# Patient Record
Sex: Male | Born: 1965 | Race: White | Hispanic: No | Marital: Married
Health system: Southern US, Community
[De-identification: ages and names within clinical notes are randomized; demographics above are authoritative.]

---

## 2000-09-01 ENCOUNTER — Encounter: Admission: RE | Admit: 2000-09-01 | Discharge: 2000-11-30 | Payer: Self-pay | Admitting: Family Medicine

## 2001-06-11 ENCOUNTER — Encounter: Payer: Self-pay | Admitting: Family Medicine

## 2001-06-11 ENCOUNTER — Encounter: Admission: RE | Admit: 2001-06-11 | Discharge: 2001-06-11 | Payer: Self-pay | Admitting: Family Medicine

## 2002-08-15 ENCOUNTER — Encounter: Payer: Self-pay | Admitting: Family Medicine

## 2002-08-15 ENCOUNTER — Encounter: Admission: RE | Admit: 2002-08-15 | Discharge: 2002-08-15 | Payer: Self-pay | Admitting: Family Medicine

## 2002-08-25 ENCOUNTER — Encounter: Admission: RE | Admit: 2002-08-25 | Discharge: 2002-08-25 | Payer: Self-pay | Admitting: Family Medicine

## 2002-08-25 ENCOUNTER — Encounter: Payer: Self-pay | Admitting: Family Medicine

## 2003-05-14 ENCOUNTER — Emergency Department (HOSPITAL_COMMUNITY): Admission: EM | Admit: 2003-05-14 | Discharge: 2003-05-14 | Payer: Self-pay | Admitting: *Deleted

## 2004-02-26 ENCOUNTER — Ambulatory Visit (HOSPITAL_BASED_OUTPATIENT_CLINIC_OR_DEPARTMENT_OTHER): Admission: RE | Admit: 2004-02-26 | Discharge: 2004-02-26 | Payer: Self-pay | Admitting: Internal Medicine

## 2004-07-26 ENCOUNTER — Ambulatory Visit: Payer: Self-pay | Admitting: Internal Medicine

## 2004-10-03 ENCOUNTER — Ambulatory Visit: Payer: Self-pay | Admitting: Internal Medicine

## 2005-06-20 ENCOUNTER — Ambulatory Visit: Payer: Self-pay | Admitting: Internal Medicine

## 2005-07-14 ENCOUNTER — Ambulatory Visit (HOSPITAL_COMMUNITY): Admission: RE | Admit: 2005-07-14 | Discharge: 2005-07-14 | Payer: Self-pay | Admitting: Internal Medicine

## 2005-07-14 ENCOUNTER — Ambulatory Visit: Payer: Self-pay | Admitting: Internal Medicine

## 2006-04-08 ENCOUNTER — Ambulatory Visit: Payer: Self-pay | Admitting: Family Medicine

## 2006-04-13 ENCOUNTER — Ambulatory Visit: Payer: Self-pay | Admitting: Internal Medicine

## 2006-09-09 ENCOUNTER — Ambulatory Visit: Payer: Self-pay | Admitting: Family Medicine

## 2006-09-14 ENCOUNTER — Ambulatory Visit: Payer: Self-pay | Admitting: Family Medicine

## 2006-09-14 LAB — CONVERTED CEMR LAB
Mumps IgG: 3.6 — ABNORMAL HIGH
Rubeola IgG: 0.88

## 2006-10-02 ENCOUNTER — Ambulatory Visit: Payer: Self-pay | Admitting: Family Medicine

## 2006-10-08 ENCOUNTER — Ambulatory Visit: Payer: Self-pay | Admitting: Family Medicine

## 2006-12-24 ENCOUNTER — Ambulatory Visit: Payer: Self-pay | Admitting: Family Medicine

## 2006-12-28 ENCOUNTER — Telehealth (INDEPENDENT_AMBULATORY_CARE_PROVIDER_SITE_OTHER): Payer: Self-pay | Admitting: *Deleted

## 2006-12-31 ENCOUNTER — Telehealth (INDEPENDENT_AMBULATORY_CARE_PROVIDER_SITE_OTHER): Payer: Self-pay | Admitting: *Deleted

## 2007-02-26 ENCOUNTER — Encounter: Payer: Self-pay | Admitting: Internal Medicine

## 2007-03-22 ENCOUNTER — Ambulatory Visit: Payer: Self-pay | Admitting: Internal Medicine

## 2007-03-27 ENCOUNTER — Emergency Department (HOSPITAL_COMMUNITY): Admission: EM | Admit: 2007-03-27 | Discharge: 2007-03-27 | Payer: Self-pay | Admitting: Emergency Medicine

## 2007-03-29 ENCOUNTER — Ambulatory Visit: Payer: Self-pay | Admitting: Family Medicine

## 2007-03-30 ENCOUNTER — Ambulatory Visit: Payer: Self-pay | Admitting: Family Medicine

## 2007-03-30 LAB — CONVERTED CEMR LAB
ALT: 31 units/L (ref 0–53)
AST: 24 units/L (ref 0–37)
Cholesterol: 165 mg/dL (ref 0–200)
HDL: 27.7 mg/dL — ABNORMAL LOW (ref >39.0)
LDL Cholesterol: 107 mg/dL — ABNORMAL HIGH (ref 0–99)
Total CHOL/HDL Ratio: 6
Triglycerides: 154 mg/dL — ABNORMAL HIGH (ref 0–149)
VLDL: 31 mg/dL (ref 0–40)

## 2007-03-31 ENCOUNTER — Telehealth (INDEPENDENT_AMBULATORY_CARE_PROVIDER_SITE_OTHER): Payer: Self-pay | Admitting: *Deleted

## 2007-03-31 ENCOUNTER — Encounter: Payer: Self-pay | Admitting: Internal Medicine

## 2007-03-31 ENCOUNTER — Ambulatory Visit: Payer: Self-pay | Admitting: Internal Medicine

## 2007-03-31 ENCOUNTER — Encounter (INDEPENDENT_AMBULATORY_CARE_PROVIDER_SITE_OTHER): Payer: Self-pay | Admitting: Family Medicine

## 2007-04-07 ENCOUNTER — Ambulatory Visit: Payer: Self-pay

## 2007-04-07 ENCOUNTER — Ambulatory Visit: Payer: Self-pay | Admitting: Internal Medicine

## 2007-04-07 ENCOUNTER — Encounter: Payer: Self-pay | Admitting: Internal Medicine

## 2007-04-15 ENCOUNTER — Ambulatory Visit (HOSPITAL_COMMUNITY): Admission: RE | Admit: 2007-04-15 | Discharge: 2007-04-15 | Payer: Self-pay | Admitting: Internal Medicine

## 2007-04-15 ENCOUNTER — Ambulatory Visit: Payer: Self-pay | Admitting: Internal Medicine

## 2007-05-20 ENCOUNTER — Ambulatory Visit: Payer: Self-pay | Admitting: Internal Medicine

## 2007-06-19 DIAGNOSIS — G4733 Obstructive sleep apnea (adult) (pediatric): Secondary | ICD-10-CM

## 2007-06-25 ENCOUNTER — Telehealth: Payer: Self-pay | Admitting: Internal Medicine

## 2007-07-12 ENCOUNTER — Ambulatory Visit: Payer: Self-pay | Admitting: Internal Medicine

## 2007-09-06 ENCOUNTER — Telehealth (INDEPENDENT_AMBULATORY_CARE_PROVIDER_SITE_OTHER): Payer: Self-pay | Admitting: Family Medicine

## 2007-09-14 ENCOUNTER — Encounter: Payer: Self-pay | Admitting: Family Medicine

## 2007-09-14 ENCOUNTER — Ambulatory Visit: Payer: Self-pay | Admitting: Family Medicine

## 2007-09-16 LAB — CONVERTED CEMR LAB: Varicella IgG: 2.96 — ABNORMAL HIGH

## 2007-11-19 ENCOUNTER — Ambulatory Visit: Payer: Self-pay | Admitting: Internal Medicine

## 2008-02-09 ENCOUNTER — Ambulatory Visit: Payer: Self-pay | Admitting: Sports Medicine

## 2008-03-20 ENCOUNTER — Encounter: Payer: Self-pay | Admitting: Internal Medicine

## 2008-03-28 ENCOUNTER — Ambulatory Visit: Payer: Self-pay | Admitting: Sports Medicine

## 2008-03-30 ENCOUNTER — Ambulatory Visit: Payer: Self-pay | Admitting: Internal Medicine

## 2008-04-04 ENCOUNTER — Encounter: Payer: Self-pay | Admitting: Internal Medicine

## 2008-07-21 ENCOUNTER — Ambulatory Visit: Payer: Self-pay | Admitting: Internal Medicine

## 2008-07-31 IMAGING — CR DG CHEST 2V
2 series · 2 of 2 positions shown · non-contrast
Comparison: None.

CLINICAL DATA: Shortness of breath. Left arm weakness. Dizziness.

CHEST - 2 VIEW

[w chest pa]
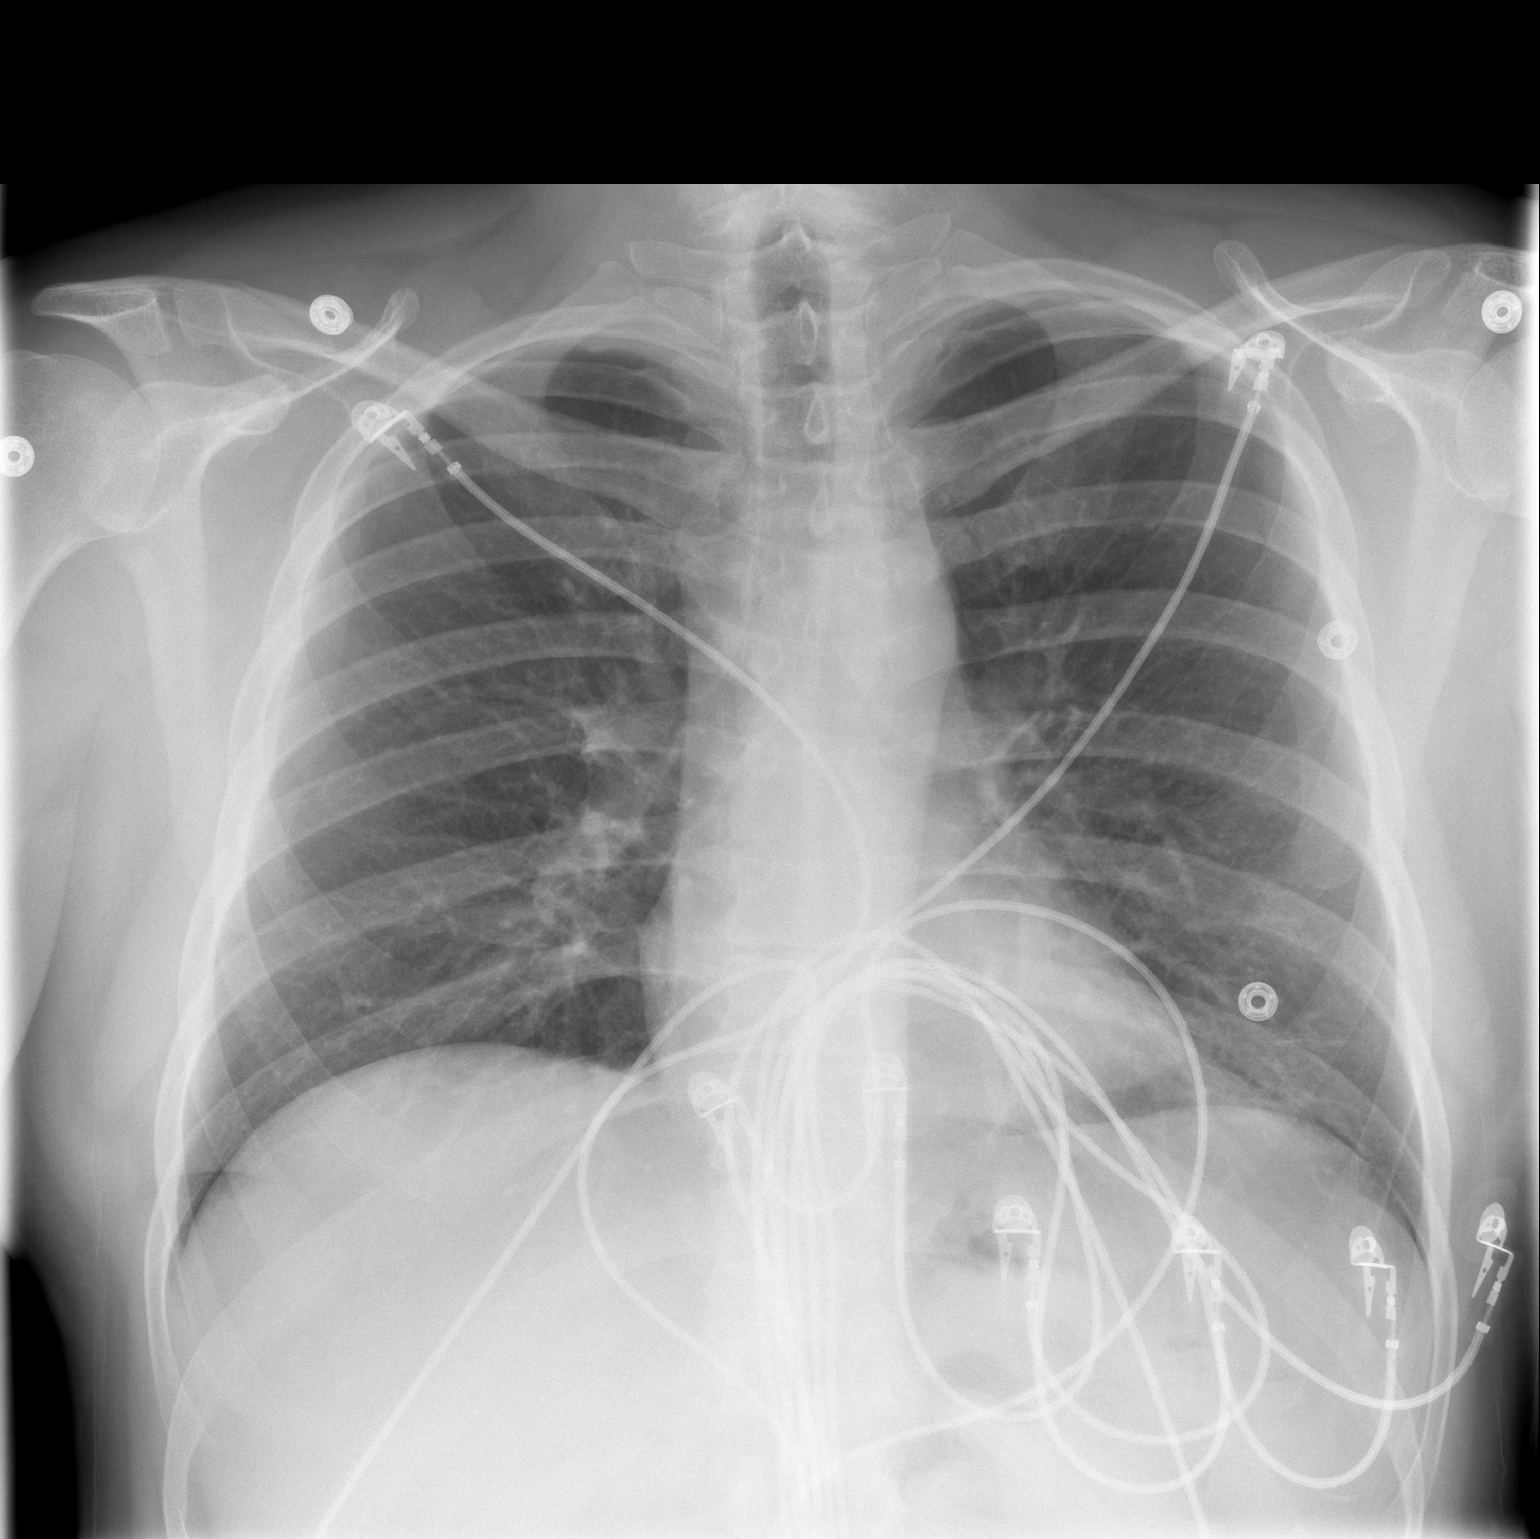

[w chest lat]
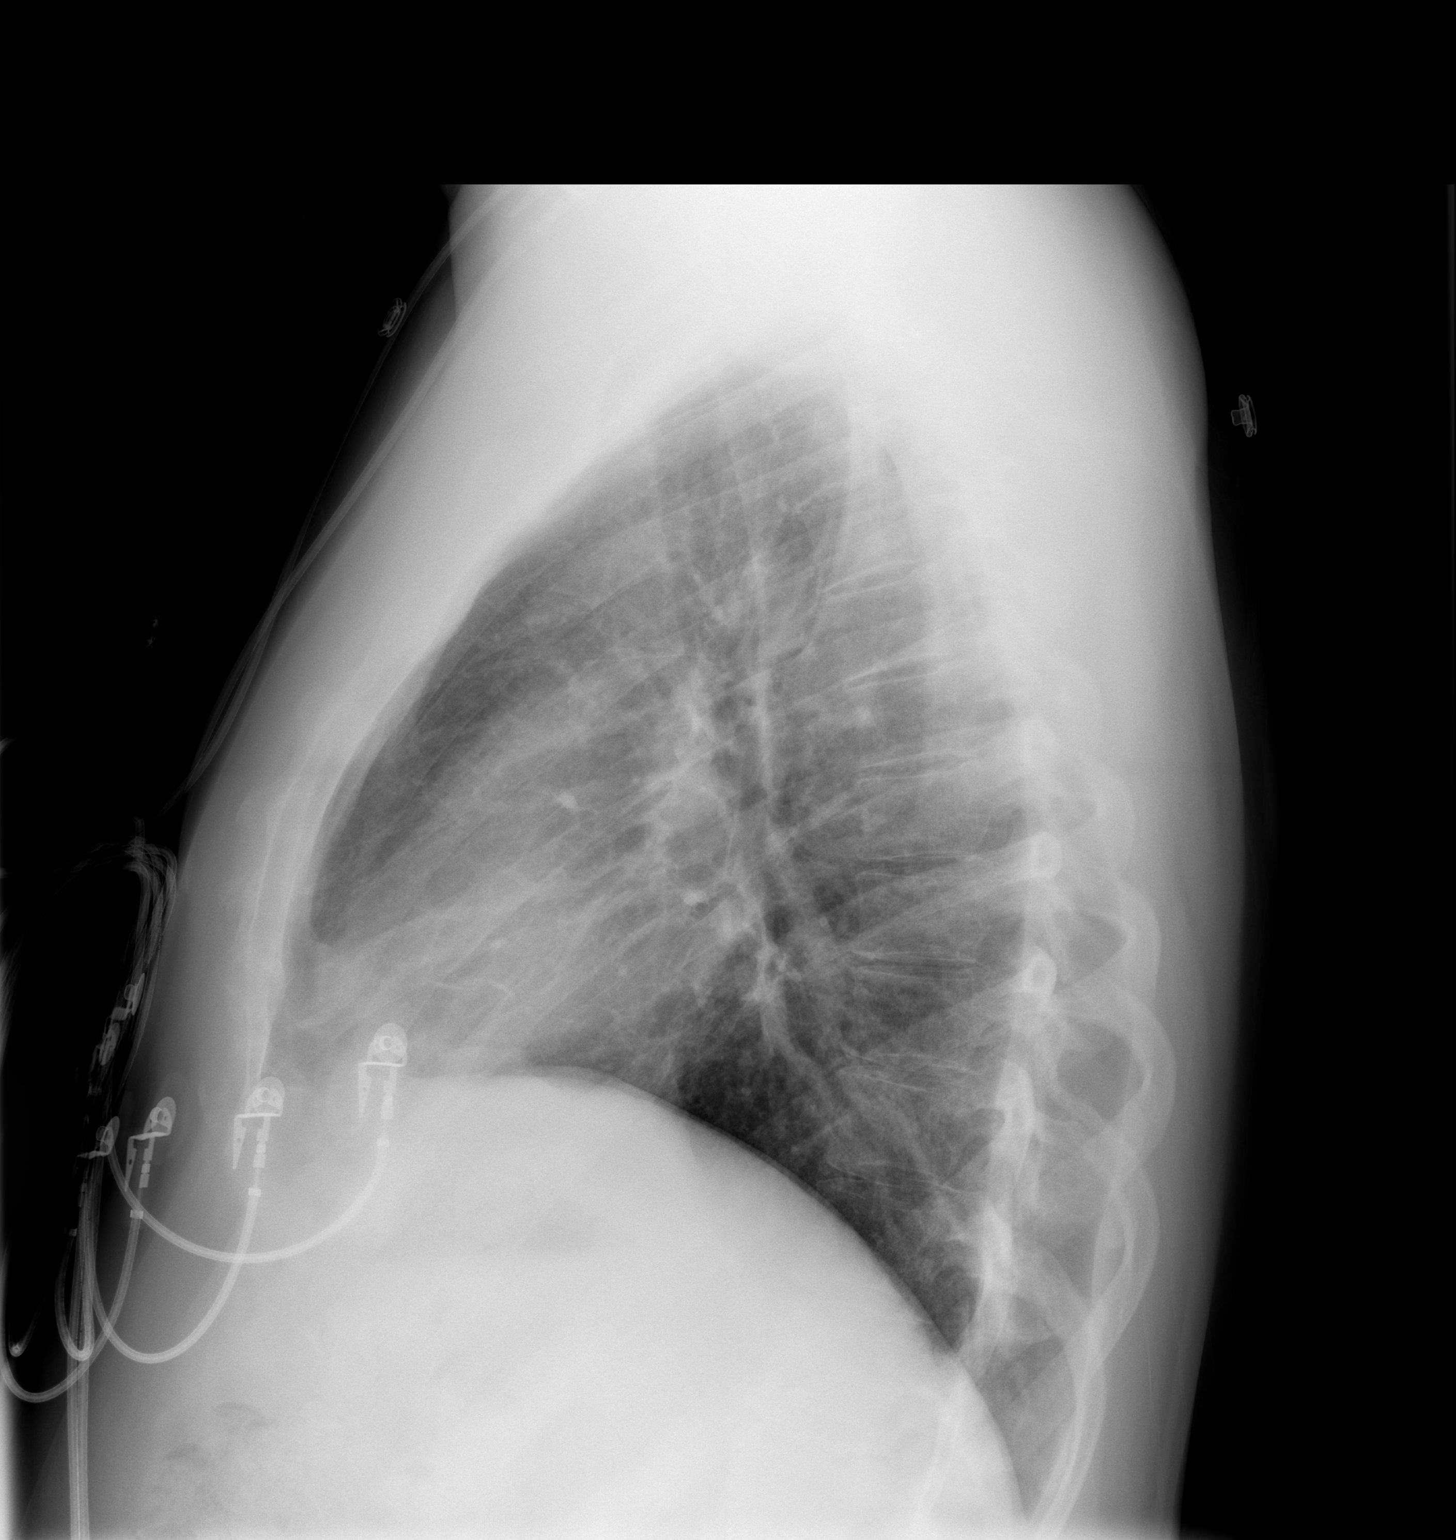

[2 of 2 positions shown; findings below may reference images not displayed]

FINDINGS: Normal sized heart. Small amount of linear density at the left lung
base. Otherwise, clear lungs with normal vascularity. Minimal scoliosis.

IMPRESSION

Mild left basilar atelectasis or scarring.

## 2008-08-04 ENCOUNTER — Ambulatory Visit: Payer: Self-pay | Admitting: Sports Medicine

## 2008-08-04 ENCOUNTER — Encounter: Payer: Self-pay | Admitting: Sports Medicine

## 2008-08-07 ENCOUNTER — Encounter (INDEPENDENT_AMBULATORY_CARE_PROVIDER_SITE_OTHER): Payer: Self-pay | Admitting: *Deleted

## 2008-08-14 ENCOUNTER — Encounter: Admission: RE | Admit: 2008-08-14 | Discharge: 2008-09-07 | Payer: Self-pay | Admitting: Sports Medicine

## 2008-08-16 ENCOUNTER — Encounter: Payer: Self-pay | Admitting: Sports Medicine

## 2008-08-27 ENCOUNTER — Encounter: Payer: Self-pay | Admitting: Internal Medicine

## 2008-09-01 ENCOUNTER — Ambulatory Visit: Payer: Self-pay | Admitting: Sports Medicine

## 2008-09-11 ENCOUNTER — Encounter: Payer: Self-pay | Admitting: Sports Medicine

## 2008-09-12 ENCOUNTER — Encounter: Payer: Self-pay | Admitting: Sports Medicine

## 2008-10-10 ENCOUNTER — Ambulatory Visit: Payer: Self-pay | Admitting: Internal Medicine

## 2008-10-22 ENCOUNTER — Encounter: Payer: Self-pay | Admitting: Internal Medicine

## 2008-11-28 ENCOUNTER — Ambulatory Visit: Payer: Self-pay | Admitting: Internal Medicine

## 2008-12-12 LAB — CONVERTED CEMR LAB
TSH: 0.92 microintl units/mL (ref 0.35–5.50)
Testosterone: 338.79 ng/dL — ABNORMAL LOW (ref 350.00–890.00)

## 2008-12-14 ENCOUNTER — Telehealth (INDEPENDENT_AMBULATORY_CARE_PROVIDER_SITE_OTHER): Payer: Self-pay | Admitting: *Deleted

## 2009-02-08 ENCOUNTER — Encounter: Payer: Self-pay | Admitting: Internal Medicine

## 2009-04-04 ENCOUNTER — Ambulatory Visit: Payer: Self-pay | Admitting: Internal Medicine

## 2009-05-28 ENCOUNTER — Ambulatory Visit: Payer: Self-pay | Admitting: Internal Medicine

## 2009-06-12 ENCOUNTER — Ambulatory Visit: Payer: Self-pay | Admitting: Internal Medicine

## 2009-06-15 ENCOUNTER — Encounter: Payer: Self-pay | Admitting: Internal Medicine

## 2009-06-15 ENCOUNTER — Telehealth: Payer: Self-pay | Admitting: Internal Medicine

## 2009-09-14 ENCOUNTER — Encounter: Payer: Self-pay | Admitting: Internal Medicine

## 2009-10-29 ENCOUNTER — Encounter: Payer: Self-pay | Admitting: Internal Medicine

## 2009-11-20 ENCOUNTER — Encounter: Payer: Self-pay | Admitting: Internal Medicine

## 2009-12-03 ENCOUNTER — Telehealth (INDEPENDENT_AMBULATORY_CARE_PROVIDER_SITE_OTHER): Payer: Self-pay | Admitting: *Deleted

## 2009-12-06 ENCOUNTER — Ambulatory Visit (HOSPITAL_COMMUNITY): Admission: RE | Admit: 2009-12-06 | Discharge: 2009-12-07 | Payer: Self-pay | Admitting: Otolaryngology

## 2009-12-24 ENCOUNTER — Encounter: Payer: Self-pay | Admitting: Internal Medicine

## 2009-12-25 ENCOUNTER — Encounter: Payer: Self-pay | Admitting: Internal Medicine

## 2010-01-31 ENCOUNTER — Encounter: Payer: Self-pay | Admitting: Internal Medicine

## 2010-03-20 ENCOUNTER — Ambulatory Visit (HOSPITAL_BASED_OUTPATIENT_CLINIC_OR_DEPARTMENT_OTHER): Admission: RE | Admit: 2010-03-20 | Discharge: 2010-03-20 | Payer: Self-pay | Admitting: Otolaryngology

## 2010-03-23 ENCOUNTER — Ambulatory Visit: Payer: Self-pay | Admitting: Internal Medicine

## 2010-04-18 ENCOUNTER — Ambulatory Visit: Payer: Self-pay | Admitting: Internal Medicine

## 2010-05-13 ENCOUNTER — Telehealth (INDEPENDENT_AMBULATORY_CARE_PROVIDER_SITE_OTHER): Payer: Self-pay | Admitting: *Deleted

## 2010-06-19 ENCOUNTER — Ambulatory Visit: Payer: Self-pay | Admitting: Internal Medicine

## 2010-06-20 ENCOUNTER — Encounter: Payer: Self-pay | Admitting: Internal Medicine

## 2010-06-21 LAB — CONVERTED CEMR LAB
ALT: 52 units/L (ref 0–53)
AST: 25 units/L (ref 0–37)
Albumin: 4.2 g/dL (ref 3.5–5.2)
Basophils Absolute: 0 10*3/uL (ref 0.0–0.1)
Basophils Relative: 0.7 % (ref 0.0–3.0)
Calcium: 9.6 mg/dL (ref 8.4–10.5)
Eosinophils Relative: 0.9 % (ref 0.0–5.0)
GFR calc non Af Amer: 70.26 mL/min (ref >60.00)
HDL: 38.6 mg/dL — ABNORMAL LOW (ref >39.00)
Hemoglobin: 16.1 g/dL (ref 13.0–17.0)
Lymphocytes Relative: 23.3 % (ref 12.0–46.0)
Monocytes Relative: 6.5 % (ref 3.0–12.0)
Neutro Abs: 4.1 10*3/uL (ref 1.4–7.7)
RBC: 5.58 M/uL (ref 4.22–5.81)
RDW: 15.7 % — ABNORMAL HIGH (ref 11.5–14.6)
Sodium: 141 meq/L (ref 135–145)
Total Protein: 7 g/dL (ref 6.0–8.3)
Triglycerides: 88 mg/dL (ref 0.0–149.0)
VLDL: 17.6 mg/dL (ref 0.0–40.0)
WBC: 6 10*3/uL (ref 4.5–10.5)

## 2010-06-25 ENCOUNTER — Ambulatory Visit: Payer: Self-pay | Admitting: Internal Medicine

## 2010-06-25 ENCOUNTER — Encounter: Payer: Self-pay | Admitting: Internal Medicine

## 2010-06-25 DIAGNOSIS — E785 Hyperlipidemia, unspecified: Secondary | ICD-10-CM

## 2010-06-25 DIAGNOSIS — D489 Neoplasm of uncertain behavior, unspecified: Secondary | ICD-10-CM | POA: Insufficient documentation

## 2010-06-25 LAB — CONVERTED CEMR LAB
HDL goal, serum: 40 mg/dL
LDL Goal: 130 mg/dL

## 2010-06-26 ENCOUNTER — Ambulatory Visit
Admission: RE | Admit: 2010-06-26 | Discharge: 2010-06-26 | Payer: Self-pay | Source: Home / Self Care | Attending: Internal Medicine | Admitting: Internal Medicine

## 2010-06-26 LAB — CONVERTED CEMR LAB: OCCULT 3: NEGATIVE

## 2010-06-27 ENCOUNTER — Encounter (INDEPENDENT_AMBULATORY_CARE_PROVIDER_SITE_OTHER): Payer: Self-pay | Admitting: *Deleted

## 2010-07-30 NOTE — Progress Notes (Signed)
  Phone Note Other Incoming   Request: Send information Summary of Call: Request for records received from  [place] with [facility]. Faxed an EKG from 2008 to [phone]....no stress test to send.

## 2010-07-30 NOTE — Consult Note (Signed)
Summary: [facility]  [facility]   Imported By: Lanelle Bal 09/20/2009 11:38:32  _____________________________________________________________________  External Attachment:    Type:   Image     Comment:   External Document

## 2010-07-30 NOTE — Consult Note (Signed)
Summary: [facility]  [facility]   Imported By: Sherian Rein 02/07/2010 15:16:08  _____________________________________________________________________  External Attachment:    Type:   Image     Comment:   External Document

## 2010-07-30 NOTE — Consult Note (Signed)
Summary: [facility]  [facility]   Imported By: Lanelle Bal 01/09/2010 11:08:29  _____________________________________________________________________  External Attachment:    Type:   Image     Comment:   External Document

## 2010-07-30 NOTE — Progress Notes (Signed)
Summary: need orders and codes for lab = 12/21--added  Phone Note Call from Patient   Caller: Patient Summary of Call: patient will be in on 12/27 for his CPX  at 11:15---will be in 2023-06-29 for labs---what orders and diag codes do you want?   thanks Initial call taken by: Jerolyn Shin,  May 14, 2010 2:38 PM  Follow-up for Phone Call        Lipid,Hep,BMP,CBCD,TSH,Stool Cards V70.0  Side Note: Patient with urologist, PSA and Udip usually checked with them Follow-up by: Shonna Chock CMA,  May 14, 2010 3:50 PM  Additional Follow-up for Phone Call Additional follow up Details #1::        added orders and codes for June 29, 2023 lab Additional Follow-up by: Jerolyn Shin,  May 14, 2010 4:57 PM

## 2010-07-30 NOTE — Assessment & Plan Note (Signed)
Summary: FLU SHOT/KN   Nurse Visit  CC: Flu shot./kb   Allergies: No Known Drug Allergies  Orders Added: 1)  Admin 1st Vaccine [90471] 2)  Flu Vaccine 43yrs + [21308]          Flu Vaccine Consent Questions     Do you have a history of severe allergic reactions to this vaccine? no    Any prior history of allergic reactions to egg and/or gelatin? no    Do you have a sensitivity to the preservative Thimersol? no    Do you have a past history of Guillan-Barre Syndrome? no    Do you currently have an acute febrile illness? no    Have you ever had a severe reaction to latex? no    Vaccine information given and explained to patient? yes    Are you currently pregnant? no    Lot Number:[id number]   Exp Date:12/28/2010   Site Given  Left Deltoid IM

## 2010-07-30 NOTE — Letter (Signed)
Summary: [facility]  [facility]   Imported By: Lanelle Bal 01/09/2010 10:03:07  _____________________________________________________________________  External Attachment:    Type:   Image     Comment:   External Document

## 2010-07-30 NOTE — Consult Note (Signed)
Summary: [facility]  [facility]   Imported By: Lanelle Bal 02/11/2010 11:57:50  _____________________________________________________________________  External Attachment:    Type:   Image     Comment:   External Document

## 2010-07-30 NOTE — Consult Note (Signed)
Summary: [facility]  [facility]   Imported By: Lanelle Bal 11/28/2009 13:28:28  _____________________________________________________________________  External Attachment:    Type:   Image     Comment:   External Document

## 2010-07-30 NOTE — Consult Note (Signed)
Summary: [facility]  [facility]   Imported By: Lanelle Bal 11/05/2009 13:46:02  _____________________________________________________________________  External Attachment:    Type:   Image     Comment:   External Document

## 2010-07-30 NOTE — Assessment & Plan Note (Signed)
Summary: FU L HIP PAIN   Vital Signs:  Patient Profile:   45 Years Old Male Height:     73 inches Pulse rate:   68 / minute BP sitting:   112 / 78  Vitals Entered By: Enid Baas MD (August 04, 2008 8:49 AM)                 PCP:  Blossom Hoops  Chief Complaint:  FU L HIP PAIN.  History of Present Illness: PATIENT IS WORKING ON WEIGHT LOSS HX OF SLEEP APNEA  has been experimenting with CPAP recently off this as he lost 27 lbs and 1 inch off neck has not rested well past 3 weeks  MWF does aerobic with running T Th Sat does calisthentic program  Changed bed which helps hip driving aggravates this HS gets weak after 15 to 20 mins of running Yoga - does once weekly  Comes back because of persistent hip pain now starts early in run when reviewing it is slightly different than when seen in fall more anterior and it also coincides when he started doing numerous squats to 90 deg in his calihenic workout Still gets HS tightness and pain with sitting      Current Allergies: No known allergies       Physical Exam  General:     Well-developed,well-nourished,in no acute distress; alert,appropriate and cooperative throughout examination Head:     Normocephalic and atraumatic without obvious abnormalities. No apparent alopecia or balding. Msk:     Left leg: normal abduction, adduction strength now has good rotation strength pain on hip flexion tighness over left hamstring tingling on pressure over sciatic notch and over piriformis good hip rotation and norm joint exam Extremities:     gait shows he is a pretty neutral runner temp orthotics control his pronation however, he is a very heavy impact striker    Impression & Recommendations:  Problem # 1:  HIP PAIN (ICD-719.45) This is different still has an element of piriformis injury and sciatic irritiation I think he has a secondary HS irritioan  now has developed hip flexor strain that is more limiting  than his piriformis  Refer for PT eval  reck here in 4 wks  Problem # 2:  FOOT PAIN (ICD-729.5) much improved in his temp orthotics  Problem # 3:  DYSPNEA/SHORTNESS OF BREATH (ICD-786.09) suspect some of this is purely conditioning has lost 27 lbs and less sleep apnea sxs he has done a great job with weight loss but I think some of this may be fatigue and even inadequate diet to full his exercise plans  will ck 3 day food diary consult with jeannie  Complete Medication List: 1)  Cpap 8  .... For sleep(advanced)   Patient Instructions: 1)  Do hip rotations and abduction/ adduction/ flexion and extension 2)  3 sets of 10 on days when you can 3)  if not 1 set of 15 4)  do them when you arenot running 5)  modify your squats to no more than 45 deg of hip flexion 6)  3 day food diary and then consult with Dr Wyona Almas 7)  PT consult - persistent hip pain; piriformis syndrome and upper hamstring tightness; today has an element of hip flexor strain as well.  2 times per week for 4 weeks 8)  Return in 4 weeks 9)  GSSI[facility] for nutrition advice 10)  Increase your hydration 11)  REcheck here in 4 weeks/ we will consider an  exercise asthma test 12)  we will consider orthotics if you still have hip pain

## 2010-08-01 NOTE — Assessment & Plan Note (Signed)
Summary: cpx --ok per chrae///sph   Vital Signs:  Patient profile:   45 year old male Height:      73 inches Weight:      188.4 pounds BMI:     24.95 Temp:     98.3 degrees F oral Pulse rate:   72 / minute Resp:     14 per minute BP sitting:   124 / 76  (left arm) Cuff size:   large  Vitals Entered By: Shonna Chock CMA (June 25, 2010 11:31 AM)  CC: CPX and discuss labs (copy given) , General Medical Evaluation, Lipid Management   Primary Care Provider:  Blossom Hoops  CC:  CPX and discuss labs (copy given) , General Medical Evaluation, and Lipid Management.  History of Present Illness:     Mr. Hewes is here for a physical; he is asymptomatic.  Lipid Management History:      Positive NCEP/ATP III risk factors include male age 39 years old or older and HDL cholesterol less than 40.  Negative NCEP/ATP III risk factors include non-diabetic, no family history for ischemic heart disease, non-tobacco-user status, non-hypertensive, no ASHD (atherosclerotic heart disease), no prior stroke/TIA, no peripheral vascular disease, and no history of aortic aneurysm.     Preventive Screening-Counseling & Management  Caffeine-Diet-Exercise     Does Patient Exercise: yes  Current Medications (verified): 1)  Androgel Pump 1.25 Gm/act (1%) Gel (Testosterone) .Marland Kitchen.. 1 X Daily  Allergies (verified): No Known Drug Allergies  Past History:  Past Medical History: OSA, resolved after second septoplasty( documented on repeat sleep study) Hyperlipidemia: Framingham Study LDL goal = < 130. 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  Occupation: Drug Representative Married Alcohol use-yes: socially  Regular exercise-yes: high intensity once weekly X 15 min Does Patient Exercise:  yes  Review of Systems  The patient denies anorexia, fever, vision loss, decreased hearing, hoarseness, chest pain, syncope, dyspnea on exertion, peripheral edema, prolonged cough, headaches, hemoptysis, abdominal pain, melena, hematochezia, severe indigestion/heartburn, hematuria, unusual weight change, abnormal bleeding, enlarged lymph nodes, and angioedema.         Weight decreasing with TLC. Small lesion L shoulder area X 6 months.   Physical Exam  General:  well-nourished;alert,appropriate and cooperative throughout examination Head:  Normocephalic and atraumatic without obvious abnormalities. No apparent alopecia  Eyes:  No corneal or conjunctival inflammation noted.Perrla. Funduscopic exam benign, without hemorrhages, exudates or papilledema. Ears:  External ear exam shows no significant lesions or deformities.  Otoscopic examination reveals clear canals, tympanic membranes are intact bilaterally without bulging, retraction, inflammation or discharge. Hearing is grossly normal bilaterally. Nose:  External nasal examination shows no deformity or inflammation. Nasal mucosa are pink and moist without lesions or exudates. Mouth:  Oral mucosa and oropharynx without lesions or exudates.  Teeth in good repair. S/P uvuloplasty & palate resection Neck:  No deformities, masses, or tenderness noted. Lungs:  Normal respiratory effort, chest expands symmetrically. Lungs are clear to auscultation, no crackles or wheezes. Heart:  Normal rate  and regular rhythm. S1 and S2 normal without gallop, murmur, click, rub  . Abdomen:  Bowel sounds positive,abdomen soft and non-tender without masses, organomegaly or hernias noted. Genitalia:  Dr Isabel Caprice Msk:  No deformity or scoliosis noted of thoracic or lumbar spine.   Pulses:  R and L carotid,radial,dorsalis pedis and posterior tibial pulses are full and equal bilaterally Extremities:  No clubbing, cyanosis, edema, or deformity noted with normal full range of motion of all joints.   Neurologic:  alert & oriented X3 and DTRs symmetrical and normal.   Skin:  Small keratotic lesion L shoulder Cervical Nodes:  No lymphadenopathy noted Axillary Nodes:  No palpable lymphadenopathy Psych:  memory intact for recent and remote, normally interactive, and good eye contact.     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 1 x daily  Lipid Assessment/Plan:      Based on NCEP/ATP III, the patient's risk factor category is "2 or more risk factors and a calculated 10 year CAD risk of < 20%".  The patient's lipid goals are as follows: Total cholesterol goal is 200; LDL cholesterol goal is 130; HDL cholesterol goal is 40; Triglyceride goal is 150.    Patient Instructions: 1)  To raise HDL : CVE , salmon & tuna , fish or flax seed oil ( 1-2 grams/ day),  Niacin or Lovaza  are options. 2)  It is important that you exercise regularly at least 20 minutes 5 times a week. If you develop chest pain, have severe difficulty breathing, or feel very tired , stop exercising immediately and seek medical attention.   Orders Added: 1)  Est. Patient 40-64 years [99396] 2)  EKG w/ Interpretation  [93000]

## 2010-08-01 NOTE — Letter (Signed)
Summary: Results Follow up Letter  [facility] [place]  [address] [place], [place] [address]   Phone: [phone]  Fax: [phone]    06/27/2010 MRN: [id number]  Lance Reyes [address] [place], [place]  [address]  Dear Mr. Dike,  The following are the results of your recent test(s):  Test         Result    Pap Smear:        Normal _____  Not Normal _____ Comments: ______________________________________________________ Cholesterol: LDL(Bad cholesterol):         Your goal is less than:         HDL (Good cholesterol):       Your goal is more than: Comments:  ______________________________________________________ Mammogram:        Normal _____  Not Normal _____ Comments:  ___________________________________________________________________ Hemoccult:        Normal _x____  Not normal _______ Comments:    _____________________________________________________________________ Other Tests:    We routinely do not discuss normal results over the telephone.  If you desire a copy of the results, or you have any questions about this information we can discuss them at your next office visit.   Sincerely,

## 2010-08-01 NOTE — Letter (Signed)
Summary: [facility]  [facility]   Imported By: Lanelle Bal 07/03/2010 11:58:56  _____________________________________________________________________  External Attachment:    Type:   Image     Comment:   External Document

## 2010-09-16 LAB — CBC
HCT: 42.9 % (ref 39.0–52.0)
MCHC: 33 g/dL (ref 30.0–36.0)
MCV: 79 fL (ref 78.0–100.0)
Platelets: 334 10*3/uL (ref 150–400)
WBC: 6.5 10*3/uL (ref 4.0–10.5)

## 2010-09-16 LAB — BASIC METABOLIC PANEL
BUN: 20 mg/dL (ref 6–23)
Calcium: 9.2 mg/dL (ref 8.4–10.5)
Chloride: 108 mEq/L (ref 96–112)
Creatinine, Ser: 1.02 mg/dL (ref 0.4–1.5)
GFR calc Af Amer: >60 mL/min (ref >60)

## 2010-11-12 NOTE — Letter (Signed)
April 07, 2007    Leanne Chang, M.D.  [facility]  [place], [place] [address]   RE:  Lance Reyes, Lance Reyes  MRN:  [id number]  /  DOB:  06/18/1966   Dear Dr. Blossom Hoops:   Thank you for referring Mr. Lance Reyes for cardiology evaluation.  As  you know, he is a very pleasant middle-aged man who has a complaint of  dyspnea and atypical chest pain with arm discomfort, he really did not  have chest pain, and was seen in the emergency department several days  ago.  He is referred today for additional evaluation.  The patient  states that for the last several years he has felt like when he exerted  himself he was more dyspneic than he should have been and has  generalized fatigue.  He was seen in the emergency department several  days ago complaining of arm pain and shortness of breath which resolved  spontaneously.  He has otherwise no specific complaints today, his exam  was otherwise unremarkable, his family history is really very minimal  and is not notable for premature coronary disease.  The patient was  otherwise asymptomatic.  I did note that his cholesterol panel  demonstrated a very low HDL.  His EKG was unremarkable with sinus rhythm  and normal axis and intervals.   The etiology of Lance Reyes dyspnea is unclear.  He may be  deconditioned, he may have an occult cardiac problem or could even have  coronary ischemia.  To this end, I have scheduled a 2D echo and, based  on the results of the echo, plan to have him undergo exercise  cardiopulmonary stress testing.  Will let this test help [place] decide  whether additional testing will be required.  I will see him back in  several weeks.   Thanks again for referring Lance Reyes for cardiology evaluation.    Sincerely,      Doylene Canning. Ladona Ridgel, MD  Electronically Signed    GWT/MedQ  DD: 04/07/2007  DT: 04/08/2007  Job #: [id number]

## 2010-11-12 NOTE — Assessment & Plan Note (Signed)
[facility]                            CARDIOLOGY OFFICE NOTE   Lance Reyes, Lance Reyes                        MRN:          [id number]  DATE:04/07/2007                            DOB:          Nov 03, 1965    Lance Reyes is referred today by Dr. Willow Reyes for evaluation of dyspnea  and left arm pain.   HISTORY OF PRESENT ILLNESS:  The patient is a very pleasant 45 year old  man whose health has been otherwise fairly good in the past.  He has a  history of seasonal allergic rhinitis.  He has a history of pneumothorax  in the remote past, status post chest tube implantation.  He has a very  remote history of undescending testicle status post removal.  The  patient notes that for the last several weeks, he has had generalized  fatigue and weakness and also noted that when he tries to exert himself,  he gets quickly short of breath.  He has had no chest pain but was seen  in the emergency department several weeks ago with shortness of breath  and left arm discomfort.  I have a difficult time deciphering his  symptoms of arm discomfort, but they are not particularly anginal  related or in nature.  Otherwise, he had no specific complaints.  The  patient does note associated nausea with his left arm numbness and  discomfort.   ADDITIONAL PAST MEDICAL HISTORY:  Unremarkable.   FAMILY HISTORY:  Negative for premature coronary disease.  His father  died of esophageal cancer.   SOCIAL HISTORY:  The patient denies tobacco or ethanol abuse.  He does  drink 1-2 alcoholic beverages per month.   REVIEW OF SYSTEMS:  Notable for fatigue and some urinary dysfunction.  Otherwise, all systems were reviewed and negative except as noted in the  HPI.   PHYSICAL EXAM:  He is a pleasant, well-appearing man in no distress.  The blood pressure today was 122/80; the pulse was 74 and regular; the  respirations were 16; the weight was 210 pounds.  HEENT:  Normocephalic,  atraumatic.  Pupils equal and round.  The  oropharynx was moist, the sclerae anicteric.  NECK:  No jugular venous distention.  There is no thyromegaly.  The  trachea was midline.  Carotids are 2+ and symmetric.  LUNGS:  Clear bilaterally to auscultation.  There are no wheezes, rales,  or rhonchi.  There is no increased work of breathing.  CARDIOVASCULAR:  Regular rate and rhythm with normal S1 and S2.  I do  not appreciate any murmurs, rubs, or gallops.  The PMI did not appear  enlarged or laterally displaced.  ABDOMEN:  Soft, nontender, nondistended.  There is no organomegaly.  The  bowel sounds are present.  There is no rebound or guarding.  EXTREMITIES:  No cyanosis, clubbing, or edema.  The pulses were 2+ and  symmetric.  NEUROLOGIC:  Alert and oriented x3.  The cranial nerves were intact, and  the strength was 5/5 and symmetric.   IMPRESSION:  1. Unexplained dyspnea associated with left arm discomfort.  2. History  of generalized fatigue.   DISCUSSION:  The etiology of the patient's symptoms is unclear.  I plan  to obtain a 2 D echocardiogram to rule out left ventricular dysfunction  or other valvular heart problems or occult pulmonary hypertension,  though I do not think he has any of these criteria by physical exam and  also cardiopulmonary stress test depending on the results on 2 D  echocardiogram. My guess is that he is morphologically dyspneic and this  is the __________ . He does have a history of sleep apnea.  He is on  CPAP machine but notes since he has been on it, he has had no  improvement.     Lance Reyes. Lance Ridgel, MD  Electronically Signed    GWT/MedQ  DD: 04/07/2007  DT: 04/08/2007  Job #: [id number]

## 2010-11-12 NOTE — Assessment & Plan Note (Signed)
[facility]                             PULMONARY OFFICE NOTE   COLUM, COLT                        MRN:          [id number]  DATE:05/20/2007                            DOB:          [birth year]    PROBLEM LIST:  1. Obstructive sleep apnea.  2. Allergic rhinitis.   HISTORY:  A 2-year followup for this gentleman who complains that he  stays tired and is snoring through his mask.  He does not think the CPAP  is holding him now.  He has gained weight.  He had a cardiology workup  for evaluation of dyspnea and left arm pain including a cardiopulmonary  exercise test but says all that checked out.  He asks about getting a  new CPAP machine, saying his is now worn out.  He was told his lung  function was good during his workup.  CPAP continues at 8-CWP.  His only  medication now is Nasacort AQ used p.r.n. with no medication allergy.   OBJECTIVE:  VITAL SIGNS:  Weight 223 pounds which is up from 212 pounds  in October 2007.  HEENT:  He is status post palatoplasty.  Voice quality is normal with no  stridor.  No significant nasal congestion.  He seems alert and  appropriate now with regular pulse.  HEART:  No murmur.  No neck vein distention, cyanosis, or edema.  LUNGS:  Unlabored breathing.  Clear chest.   IMPRESSION:  Obstructive sleep apnea with symptoms suggesting loss of  control likely related to his weight gain and possibly to failure of his  aging CPAP machine.   PLAN:  Weight loss was counseled and I have again reminded him of his  responsibility to be alert while driving.  We are re-titrating his CPAP.  He is interested in trying auto-titration and we will explore that  option as a comparison to fixed pressure.  He is given flu vaccine.  Schedule return in 6 months but earlier p.r.n.     Clinton D. Maple Hudson, MD, Tonny Bollman, FACP  Electronically Signed    CDY/MedQ  DD: 05/22/2007  DT: 05/23/2007  Job #: [id number]   cc:   Leanne Chang, M.D.

## 2010-11-15 NOTE — Procedures (Signed)
Lance Reyes, Lance Reyes               ACCOUNT NO.:  [id number]   MEDICAL RECORD NO.:  [id number]          PATIENT TYPE:  OUT   LOCATION:  SLEEP CENTER                 FACILITY:  [facility]   PHYSICIAN:  Clinton D. Maple Hudson, M.D. DATE OF BIRTH:  [birth year]   DATE OF ADMISSION:  02/26/2004  DATE OF DISCHARGE:  02/26/2004                              NOCTURNAL POLYSOMNOGRAM   REFERRING PHYSICIAN:  Clinton D. Maple Hudson, M.D.   INDICATION FOR STUDY AND HISTORY:  Hypersomnia with sleep apnea.   Epworth sleepiness score 14/24, neck size 17 inches, BMI 28, weight 215  pounds.   MEDICATIONS:  Advil.   SLEEP ARCHITECTURE:  Total sleep time 405 minutes with sleep efficiency of  89%.  Stage I was 6%; stage II, 63%; Stages III and IV 5%.  REM was 26% of  total sleep time.  Latency to sleep onset 13 minutes, latency to REM 47  minutes.  Awake after sleep onset 35 minute.  Arousal index 5.2 per hour.   RESPIRATORY DATA:  Split study protocol.  RDI 11.6 per hour consistent with  mild obstructive apnea/hypopnea syndrome before CPAP. This included 9  obstructive apneas, 1 central apnea, and 26 hypopneas.  All events while  sleeping supine.  REM RDI 13 per hour.  CPAP titration to 6 CWP, RDI 0 per  hour, using a medium Respironics Comfort gel mask.   OXYGEN DATA:  Mild snoring and oral venting.  Mild oxygen desaturation to a  nadir of 82% before CPAP.  After CPAP titration, held 95%.   CARDIAC DATA:  Normal cardiac rhythm.   MOVEMENT AND PARASOMNIA:  Occasional leg jerks.   IMPRESSION AND RECOMMENDATIONS:  Mild obstructive sleep apnea/hypopnea  syndrome, RDI 1l.6 per hour.  Good CPAP control at 6 CWP, RDI 0 per hour,  using a medium Respironics comfort gel mask.   Clinton D. Maple Hudson, M.D.  Diplomate, Biomedical engineer of Sleep Medicine                                  ______________________________                                Rennis Chris. Maple Hudson, M.D.                                Diplomate, American Board  of Sleep Medicine   CDY/MEDQ  D:  03/03/2004 12:42:57  T:  03/04/2004 15:20:57  Job:  [id number]

## 2011-04-10 LAB — DIFFERENTIAL
Basophils Absolute: 0.1
Basophils Relative: 1
Monocytes Relative: 7
Neutro Abs: 5.9
Neutrophils Relative %: 61

## 2011-04-10 LAB — I-STAT 8, (EC8 V) (CONVERTED LAB)
Acid-base deficit: 1
Bicarbonate: 24.6 — ABNORMAL HIGH
Glucose, Bld: 95
HCT: 45
Hemoglobin: 15.3
Operator id: 285491
Potassium: 3.9
Sodium: 140
TCO2: 26

## 2011-04-10 LAB — CBC
MCHC: 33.3
MCV: 78.4
Platelets: 475 — ABNORMAL HIGH
RBC: 5.18
WBC: 9.6

## 2011-04-10 LAB — POCT CARDIAC MARKERS
CKMB, poc: 1.1
CKMB, poc: <1 — ABNORMAL LOW
Myoglobin, poc: 82.9
Operator id: 151321
Troponin i, poc: <0.05
Troponin i, poc: <0.05

## 2012-04-12 ENCOUNTER — Encounter: Payer: Self-pay | Admitting: Internal Medicine

## 2013-01-07 ENCOUNTER — Encounter: Payer: Self-pay | Admitting: Internal Medicine

## 2016-05-01 ENCOUNTER — Telehealth: Payer: Self-pay | Admitting: Internal Medicine

## 2016-05-01 NOTE — Telephone Encounter (Signed)
Patient lives in [place] and needs an esophogeal manometry. He is asking if Dr. Henrene Pastor could do this for him.  Patient hasn't been seen since 2008.  I advised he would need a consult with Dr. Henrene Pastor then would need to return at a later date for a mano.  He is advised to find a teaching hospital or local GI MD that does perform mano.
